# Patient Record
Sex: Male | Born: 1980 | Race: Black or African American | Hispanic: No | Marital: Single
Health system: Southern US, Community
[De-identification: ages and names within clinical notes are randomized; demographics above are authoritative.]

---

## 2015-02-06 ENCOUNTER — Emergency Department (INDEPENDENT_AMBULATORY_CARE_PROVIDER_SITE_OTHER)
Admission: EM | Admit: 2015-02-06 | Discharge: 2015-02-06 | Disposition: A | Discharge status: Home / Self Care | Payer: Managed Care, Other (non HMO) | Source: Home / Self Care | Attending: Family Medicine | Admitting: Family Medicine

## 2015-02-06 ENCOUNTER — Encounter (HOSPITAL_COMMUNITY): Payer: Self-pay | Admitting: *Deleted

## 2015-02-06 DIAGNOSIS — S39012A Strain of muscle, fascia and tendon of lower back, initial encounter: Secondary | ICD-10-CM | POA: Diagnosis not present

## 2015-02-06 MED ORDER — IBUPROFEN 800 MG PO TABS: 800.0000 mg | ORAL_TABLET | Freq: Three times a day (TID) | ORAL | Status: AC

## 2015-02-06 MED ORDER — METAXALONE 800 MG PO TABS: 800.0000 mg | ORAL_TABLET | Freq: Three times a day (TID) | ORAL | Status: AC

## 2015-02-06 NOTE — Discharge Instructions (Signed)
Heat, stretch as advised,and medicine as needed.

## 2015-02-06 NOTE — ED Provider Notes (Signed)
CSN: [id number]     Arrival date & time 02/06/15  1848 History   First MD Initiated Contact with Patient 02/06/15 2007     Chief Complaint  Patient presents with  . Back Pain   (Consider location/radiation/quality/duration/timing/severity/associated sxs/prior Treatment) Patient is a 34 y.o. male presenting with back pain. The history is provided by the patient.  Back Pain Location:  Lumbar spine Quality:  Stiffness Radiates to:  Does not radiate Pain severity:  Mild Onset quality:  Sudden Duration:  4 hours Progression:  Worsening Chronicity:  Recurrent Context: lifting heavy objects, physical stress and twisting   Relieved by:  None tried Worsened by:  Nothing tried Associated symptoms: no abdominal pain, no abdominal swelling, no bladder incontinence, no bowel incontinence, no fever, no numbness, no paresthesias, no tingling and no weakness     History reviewed. No pertinent past medical history. History reviewed. No pertinent past surgical history. History reviewed. No pertinent family history. Social History  Substance Use Topics  . Smoking status: None  . Smokeless tobacco: None  . Alcohol Use: No    Review of Systems  Constitutional: Negative.  Negative for fever.  Gastrointestinal: Negative.  Negative for abdominal pain and bowel incontinence.  Genitourinary: Negative for bladder incontinence.  Musculoskeletal: Positive for back pain. Negative for myalgias, joint swelling and gait problem.  Skin: Negative.   Neurological: Negative for tingling, weakness, numbness and paresthesias.  All other systems reviewed and are negative.   Allergies  Review of patient's allergies indicates no known allergies.  Home Medications   Prior to Admission medications   Medication Sig Start Date End Date Taking? Authorizing Provider  naproxen sodium (ANAPROX) 220 MG tablet Take 220 mg by mouth 2 (two) times daily with a meal.   Yes Historical Provider, MD   Meds Ordered and  Administered this Visit  Medications - No data to display  BP 138/65 mmHg  Pulse 78  Temp(Src) 98.6 F (37 C) (Oral)  Resp 18  SpO2 100% No data found.   Physical Exam  Constitutional: He is oriented to person, place, and time. He appears well-developed and well-nourished. He appears distressed.  Abdominal: Soft. Bowel sounds are normal. There is no tenderness. There is no rebound.  Musculoskeletal: He exhibits tenderness.       Thoracic back: He exhibits decreased range of motion and tenderness. He exhibits no pain and no spasm.  Neurological: He is alert and oriented to person, place, and time.  Skin: Skin is warm and dry.  Nursing note and vitals reviewed.   ED Course  Procedures (including critical care time)  Labs Review Labs Reviewed - No data to display  Imaging Review No results found.   Visual Acuity Review  Right Eye Distance:   Left Eye Distance:   Bilateral Distance:    Right Eye Near:   Left Eye Near:    Bilateral Near:         MDM  No diagnosis found.     Linna HoffJames D Shawne Eskelson, MD 02/06/15 2024

## 2015-02-06 NOTE — ED Notes (Signed)
Pt  Reports    Symptoms  Of     Back   Pain  For  Several  Hours   Pt  denys  Any  specefic  Injury            pT  REPORTS  A  HISTORY  OF  SIMILAR  BACK  PROBLEMS  IN  PAST  SITTING  UPRIGHT ON THE  EXAM TABLE  IN NO  SEVERE  DISTRESS

## 2018-09-29 ENCOUNTER — Other Ambulatory Visit: Payer: Self-pay

## 2018-09-29 DIAGNOSIS — Z20822 Contact with and (suspected) exposure to covid-19: Secondary | ICD-10-CM

## 2018-10-01 LAB — NOVEL CORONAVIRUS, NAA: SARS-CoV-2, NAA: NOT DETECTED

## 2018-10-01 LAB — SPECIMEN STATUS REPORT

## 2019-05-03 ENCOUNTER — Other Ambulatory Visit: Payer: Self-pay

## 2019-05-03 ENCOUNTER — Emergency Department (HOSPITAL_COMMUNITY): Payer: Self-pay

## 2019-05-03 ENCOUNTER — Emergency Department (HOSPITAL_COMMUNITY)
Admission: EM | Admit: 2019-05-03 | Discharge: 2019-05-03 | Disposition: A | Discharge status: Home / Self Care | Payer: Self-pay | Attending: Emergency Medicine | Admitting: Emergency Medicine

## 2019-05-03 ENCOUNTER — Encounter (HOSPITAL_COMMUNITY): Payer: Self-pay | Admitting: Emergency Medicine

## 2019-05-03 DIAGNOSIS — G8929 Other chronic pain: Secondary | ICD-10-CM | POA: Insufficient documentation

## 2019-05-03 DIAGNOSIS — M545 Low back pain: Secondary | ICD-10-CM | POA: Insufficient documentation

## 2019-05-03 DIAGNOSIS — Z79899 Other long term (current) drug therapy: Secondary | ICD-10-CM | POA: Insufficient documentation

## 2019-05-03 NOTE — Discharge Instructions (Addendum)
You may use tylenol or ibuprofen, available over the counter according to label instructions.  You may also use lidoderm cream

## 2019-05-03 NOTE — ED Triage Notes (Signed)
Patient c/o lower back pain with intermittent radiation down left leg. Hx chronic back pain. Denies incontinence, numbness, and tingling.

## 2019-05-03 NOTE — ED Provider Notes (Signed)
[facility] DEPT Provider Note   CSN: [id number] Arrival date & time: 05/03/19  1536     History Chief Complaint  Patient presents with  . Back Pain    Jay Bowen is a 39 y.o. male.  The history is provided by the patient and medical records. No language interpreter was used.   Jay Bowen is a 39 y.o. male who presents to the Emergency Department complaining of back pain.  He has a hx/o chronic back pain since the age of 29.  Pain is located in the midline low back and at times radiates to the left hip.  Pain is described as sharp in nature and flared up a few days ago.  Pain is worse with walking and standing.  No new injuries.  He has not been evaluated for the pain before.  He took ibuprofen and tylenol at home for pain with partial improvement.  Denies fevers, abdominal pain, nausea, vomiting, diarrhea, numbness/weakness, urinary or bowel sxs.  He has no medical problems and takes no prescription medications.  No IVDA.      History reviewed. No pertinent past medical history.  There are no problems to display for this patient.   History reviewed. No pertinent surgical history.     No family history on file.  Social History   Tobacco Use  . Smoking status: Not on file  Substance Use Topics  . Alcohol use: No  . Drug use: Not on file    Home Medications Prior to Admission medications   Medication Sig Start Date End Date Taking? Authorizing Provider  ibuprofen (ADVIL,MOTRIN) 800 MG tablet Take 1 tablet (800 mg total) by mouth 3 (three) times daily. 02/06/15   Billy Fischer, MD  metaxalone (SKELAXIN) 800 MG tablet Take 1 tablet (800 mg total) by mouth 3 (three) times daily. As muscle relaxer 02/06/15   Billy Fischer, MD  naproxen sodium (ANAPROX) 220 MG tablet Take 220 mg by mouth 2 (two) times daily with a meal.    [provider]    Allergies    Patient has no known allergies.  Review of Systems   Review of Systems    All other systems reviewed and are negative.   Physical Exam Updated Vital Signs BP (!) 147/109 (BP Location: Left Arm) Comment: informed the nurase of b/p  Pulse 96   Temp 99.5 F (37.5 C) (Oral)   Resp 18   SpO2 100%   Physical Exam Vitals and nursing note reviewed.  Constitutional:      Appearance: He is well-developed.  HENT:     Head: Normocephalic and atraumatic.  Cardiovascular:     Rate and Rhythm: Normal rate and regular rhythm.     Heart sounds: No murmur.  Pulmonary:     Effort: Pulmonary effort is normal. No respiratory distress.     Breath sounds: Normal breath sounds.  Abdominal:     Palpations: Abdomen is soft.     Tenderness: There is no abdominal tenderness. There is no guarding or rebound.  Musculoskeletal:        General: No swelling or tenderness.     Comments: No midline spine or CVA tenderness.  2+ DP pulses bilaterally.  Skin:    General: Skin is warm and dry.  Neurological:     Mental Status: He is alert and oriented to person, place, and time.     Comments: 5/5 strength in all four extremities with sensation to light touch intact all  four extremities.    Psychiatric:        Behavior: Behavior normal.     ED Results / Procedures / Treatments   Labs (all labs ordered are listed, but only abnormal results are displayed) Labs Reviewed - No data to display  EKG None  Radiology DG Lumbar Spine Complete  Result Date: 05/03/2019 CLINICAL DATA:  Acute on chronic low back pain. Left greater than right. No known injury. EXAM: LUMBAR SPINE - COMPLETE 4+ VIEW COMPARISON:  None. FINDINGS: Mild straightening of normal lordosis. The alignment is otherwise maintained. Vertebral body heights are normal. There is no listhesis. The posterior elements are intact. Minimal disc space narrowing at L5-S1. Disc spaces otherwise preserved. No fracture. No bony destruction. Sacroiliac joints are symmetric and normal. IMPRESSION: Mild straightening of normal lordosis,  can be seen with muscle spasm. Minimal disc space narrowing at L5-S1. Electronically Signed   By: Narda Rutherford M.D.   On: 05/03/2019 17:56    Procedures Procedures (including critical care time)  Medications Ordered in ED Medications - No data to display  ED Course  I have reviewed the triage vital signs and the nursing notes.  Pertinent labs & imaging results that were available during my care of the patient were reviewed by me and considered in my medical decision making (see chart for details).    MDM Rules/Calculators/A&P                      Pt here for evaluation of acute on chronic low back pain.  He is NVI on exam.  Plain films without significant abnormality.  D/w pt home care for acute on chronic low back pain.  Discussed outpatient follow up.    Presentation not c/w AAA, epidural abscess, renal colic.    Final Clinical Impression(s) / ED Diagnoses Final diagnoses:  Chronic midline low back pain without sciatica    Rx / DC Orders ED Discharge Orders    None       Tilden Fossa, MD 05/03/19 [facility] Primus

## 2021-11-02 IMAGING — CR DG LUMBAR SPINE COMPLETE 4+V
5 series · 5 of 5 positions shown · non-contrast
Comparison: None.

CLINICAL DATA: Acute on chronic low back pain. Left greater than
right. No known injury.

EXAM:
LUMBAR SPINE - COMPLETE 4+ VIEW

[t lumbar spine ap]
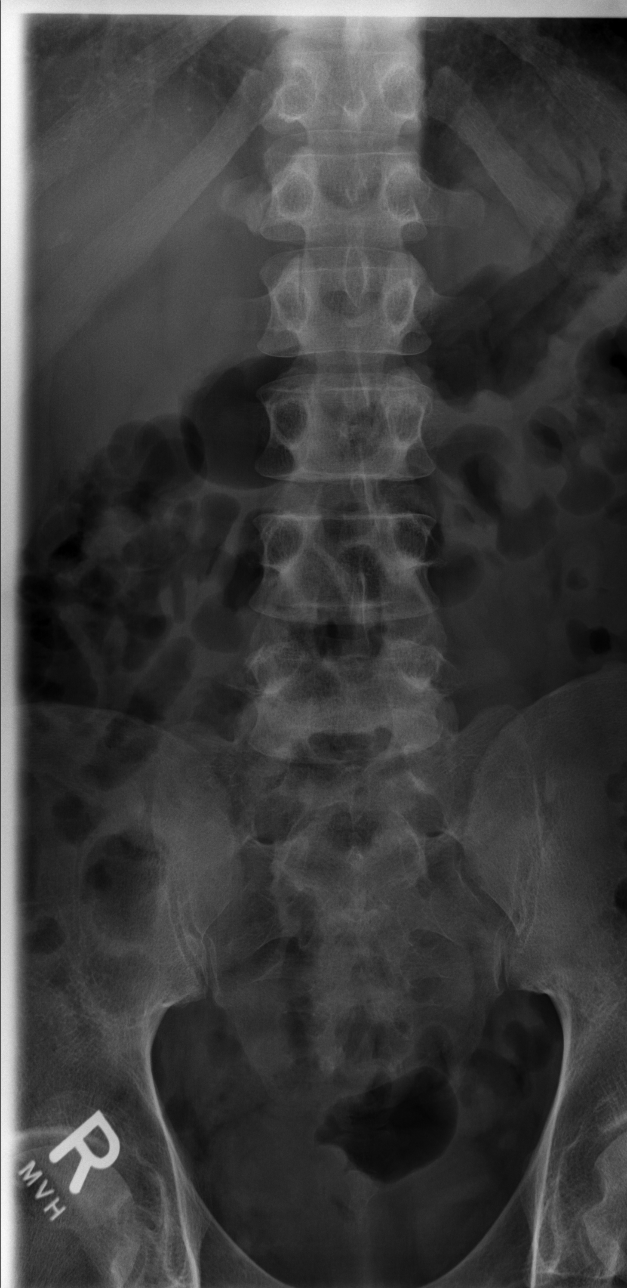

[t lumbar spine obl (1 of 2)]
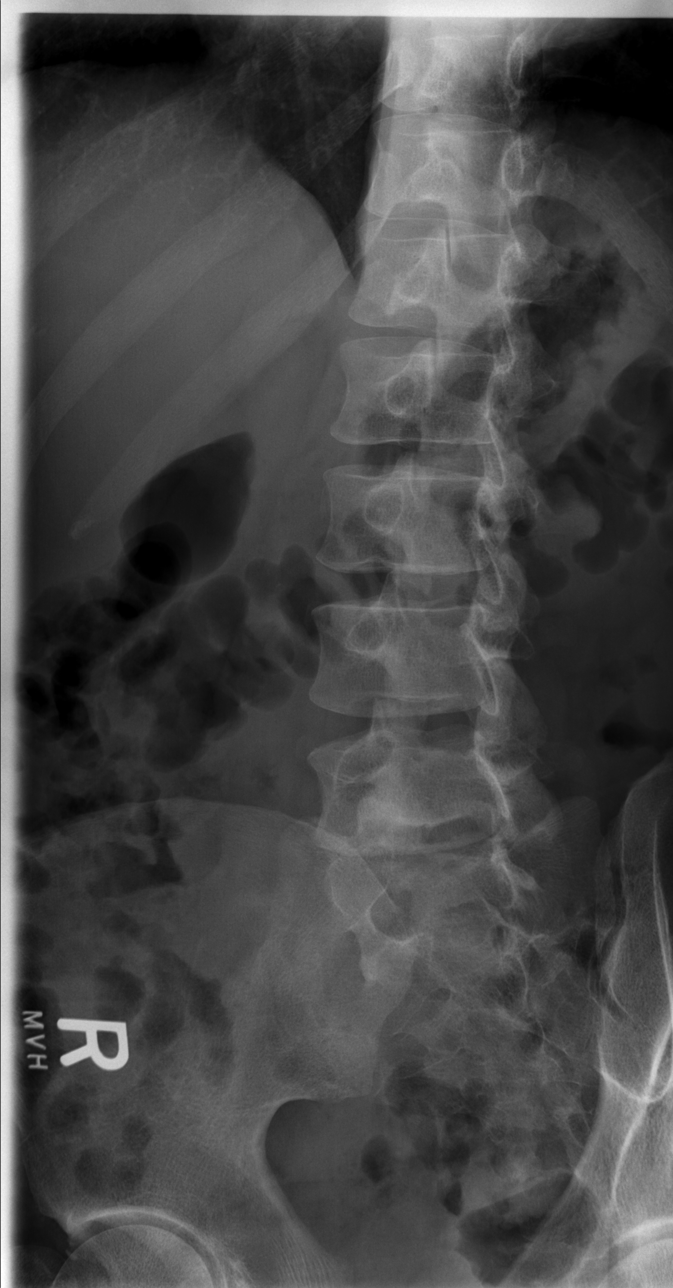

[t lumbar spine obl (2 of 2)]
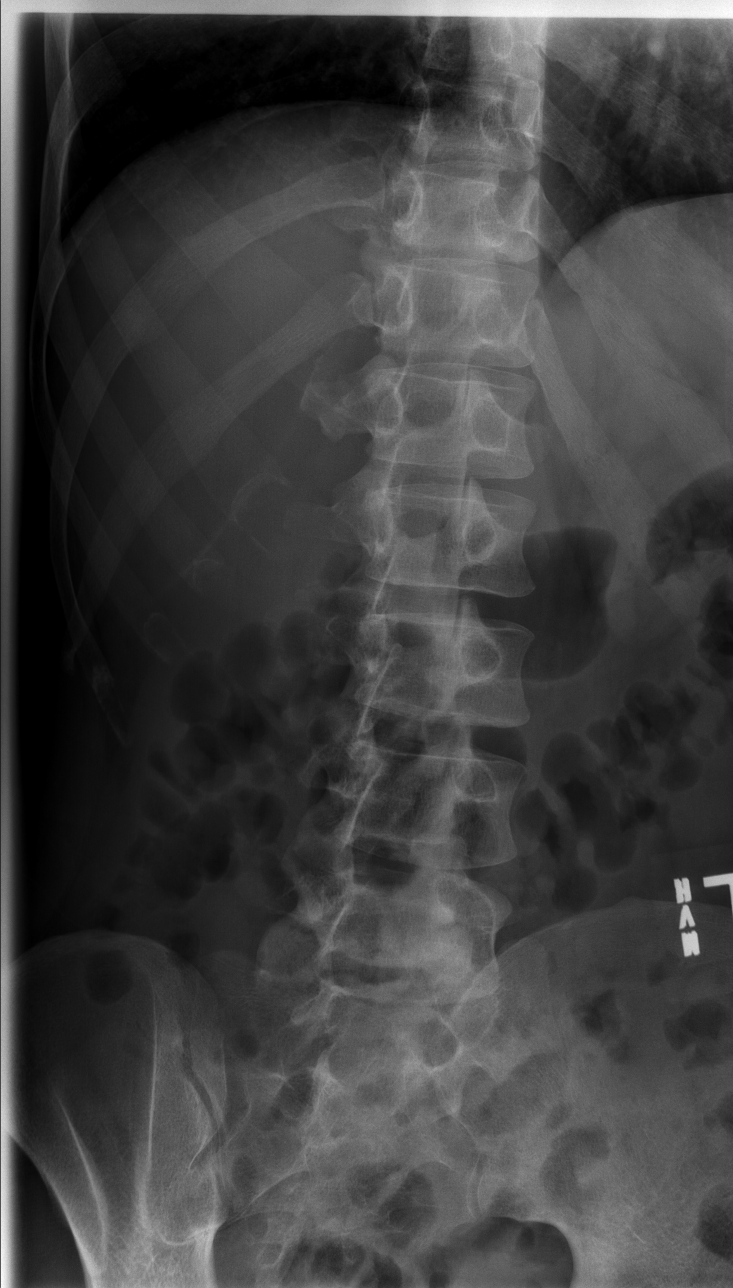

[t lumbar spine lat]
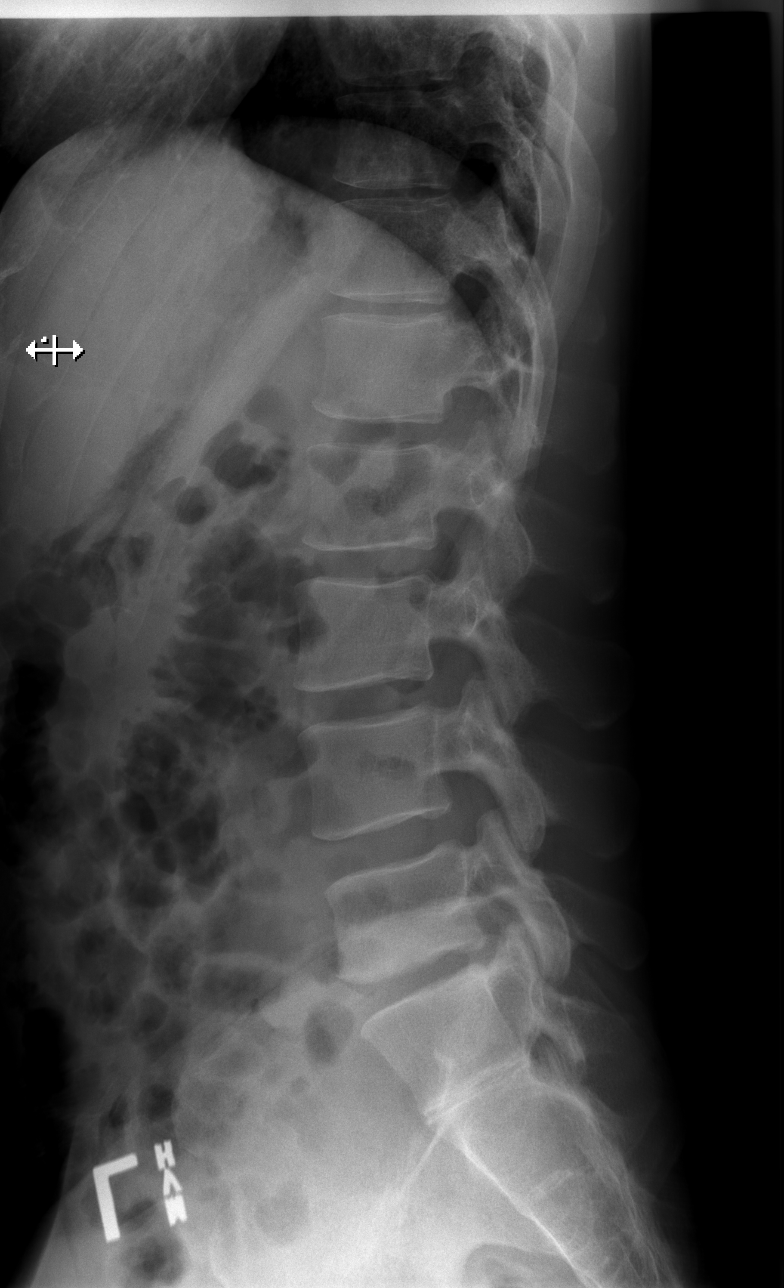

[t lumbar l-5 s-1 spot]
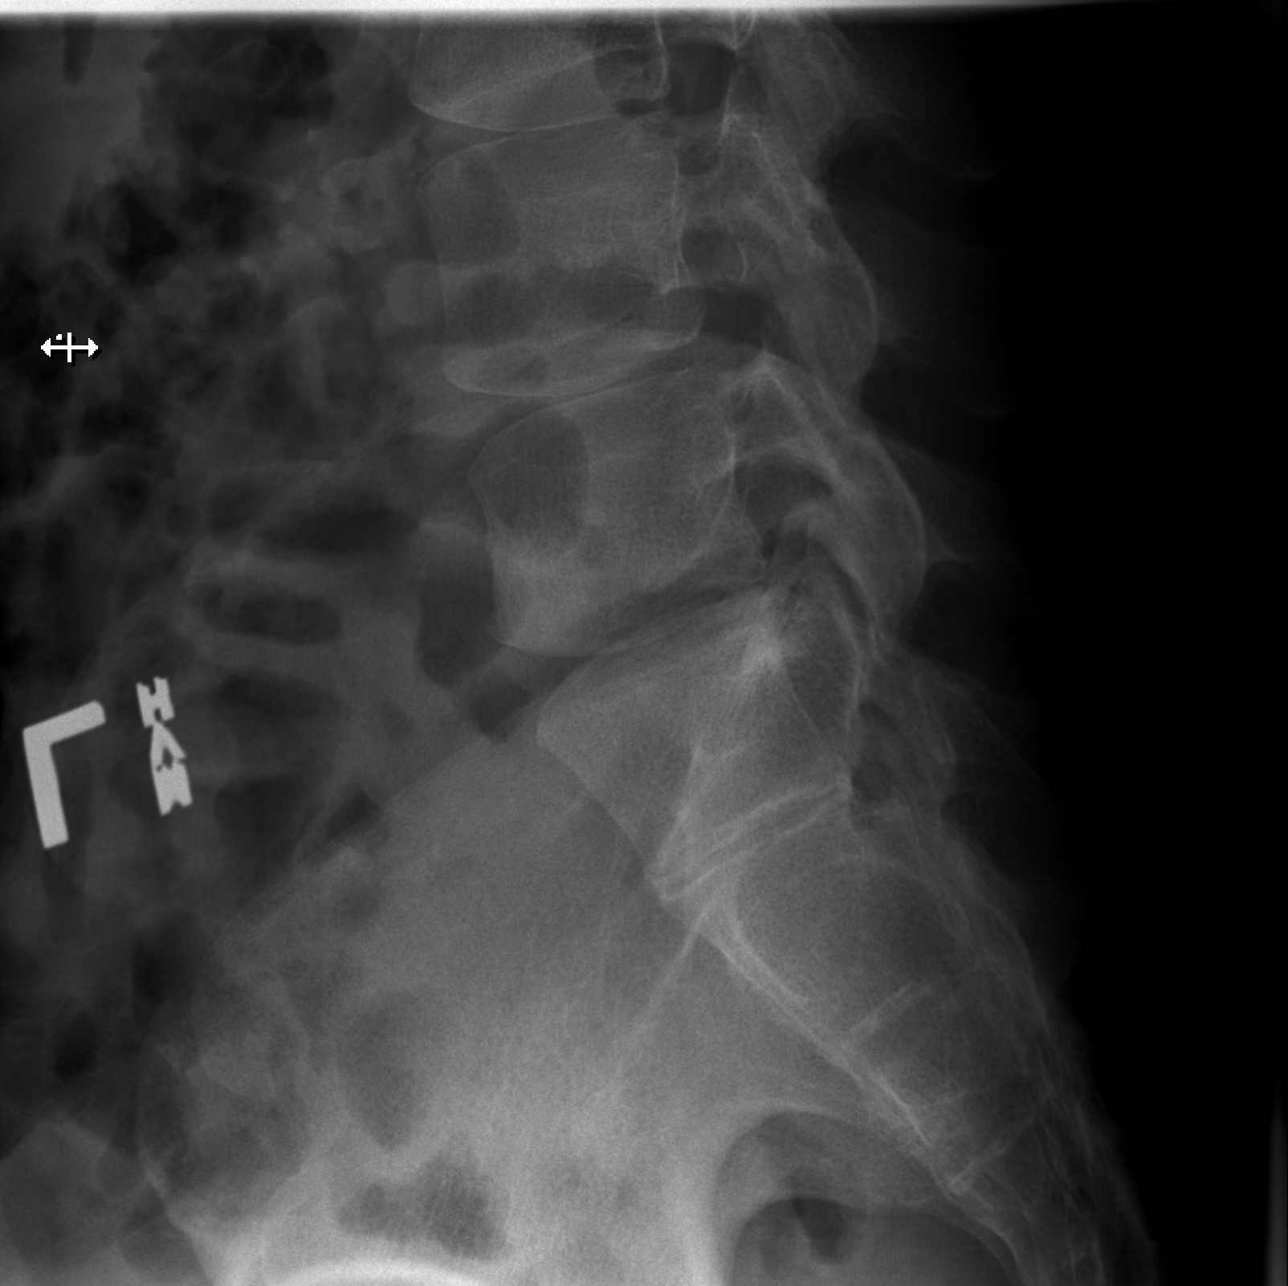

[5 of 5 positions shown; findings below may reference images not displayed]

FINDINGS: Mild straightening of normal lordosis. The alignment is otherwise
maintained. Vertebral body heights are normal. There is no
listhesis. The posterior elements are intact. Minimal disc space
narrowing at L5-S1. Disc spaces otherwise preserved. No fracture. No
bony destruction. Sacroiliac joints are symmetric and normal.
IMPRESSION: Mild straightening of normal lordosis, can be seen with muscle
spasm. Minimal disc space narrowing at L5-S1.
